# Patient Record
Sex: Female | Born: 1960 | Race: White | Hispanic: No | Marital: Single | Smoking: Former smoker
Health system: Southern US, Community
[De-identification: ages and names within clinical notes are randomized; demographics above are authoritative.]

## PROBLEM LIST (undated history)

## (undated) DIAGNOSIS — E785 Hyperlipidemia, unspecified: Secondary | ICD-10-CM

## (undated) HISTORY — DX: Hyperlipidemia, unspecified: E78.5

---

## 2007-04-27 ENCOUNTER — Encounter: Admission: RE | Admit: 2007-04-27 | Discharge: 2007-04-27 | Payer: Self-pay | Admitting: Family Medicine

## 2007-05-03 ENCOUNTER — Encounter: Admission: RE | Admit: 2007-05-03 | Discharge: 2007-05-03 | Payer: Self-pay | Admitting: Family Medicine

## 2009-07-08 ENCOUNTER — Encounter: Admission: RE | Admit: 2009-07-08 | Discharge: 2009-07-08 | Payer: Self-pay | Admitting: Family Medicine

## 2010-03-07 ENCOUNTER — Encounter: Payer: Self-pay | Admitting: Family Medicine

## 2014-10-23 ENCOUNTER — Ambulatory Visit: Payer: Self-pay | Admitting: Family

## 2014-10-23 ENCOUNTER — Encounter: Payer: Self-pay | Admitting: Family

## 2014-10-23 VITALS — BP 138/93 | HR 78 | Temp 98.6°F | Ht 64.0 in | Wt 216.4 lb

## 2014-10-23 DIAGNOSIS — M5412 Radiculopathy, cervical region: Secondary | ICD-10-CM

## 2014-10-23 MED ORDER — METHYLPREDNISOLONE ACETATE 80 MG/ML IJ SUSP
80.0000 mg | Freq: Once | INTRAMUSCULAR | Status: AC
  Administered 2014-10-23: 80 mg via INTRAMUSCULAR

## 2014-10-23 MED ORDER — MELOXICAM 15 MG PO TABS: 15.0000 mg | ORAL_TABLET | Freq: Every day | ORAL | Status: DC

## 2014-10-23 MED ORDER — KETOROLAC TROMETHAMINE 60 MG/2ML IM SOLN
60.0000 mg | Freq: Once | INTRAMUSCULAR | Status: AC
  Administered 2014-10-23: 60 mg via INTRAMUSCULAR

## 2014-10-23 MED ORDER — CYCLOBENZAPRINE HCL 10 MG PO TABS: 10.0000 mg | ORAL_TABLET | Freq: Three times a day (TID) | ORAL | Status: DC | PRN

## 2014-10-23 NOTE — Patient Instructions (Signed)

## 2014-10-23 NOTE — Progress Notes (Signed)
   Subjective:    Patient ID: Patty Bullock, female    DOB: 1960-11-14, 54 y.o.   MRN: [id number]  Pt presents to the office today to establish care. Pt states she is having left arm and shoulder pain. Pt states about three weeks ago she woke up in the middle of the night with the pain. Pt states she thought she slept or moved wrong and cause an injury. Pt was seen at the Urgent Care and was told she had a "pulled muscle". She was given a muscle relaxer and NSAID's for one week. Pt states that seemed to help a little.  Arm Pain  The incident occurred more than 1 week ago (three weeks ago). There was no injury mechanism. The pain is present in the left shoulder. The quality of the pain is described as aching. The pain is at a severity of 8/10. The pain is moderate. Associated symptoms include numbness (Left index, middle, and ring finger). She has tried NSAIDs and rest for the symptoms. The treatment provided mild relief.      Review of Systems  Constitutional: Negative.   HENT: Negative.   Eyes: Negative.   Respiratory: Negative.  Negative for shortness of breath.   Cardiovascular: Negative.  Negative for palpitations.  Gastrointestinal: Negative.   Endocrine: Negative.   Genitourinary: Negative.   Musculoskeletal: Negative.   Neurological: Positive for numbness (Left index, middle, and ring finger). Negative for headaches.  Hematological: Negative.   Psychiatric/Behavioral: Negative.   All other systems reviewed and are negative.      Objective:   Physical Exam  Constitutional: She is oriented to person, place, and time. She appears well-developed and well-nourished. No distress.  HENT:  Head: Normocephalic and atraumatic.  Eyes: Pupils are equal, round, and reactive to light.  Neck: Normal range of motion. Neck supple. No thyromegaly present.  Cardiovascular: Normal rate, regular rhythm, normal heart sounds and intact distal pulses.   No murmur heard. Pulmonary/Chest: Effort  normal and breath sounds normal. No respiratory distress. She has no wheezes.  Abdominal: Soft. Bowel sounds are normal. She exhibits no distension. There is no tenderness.  Musculoskeletal: Normal range of motion. She exhibits no edema or tenderness.  Neurological: She is alert and oriented to person, place, and time. She has normal reflexes. No cranial nerve deficit.  Skin: Skin is warm and dry.  Psychiatric: She has a normal mood and affect. Her behavior is normal. Judgment and thought content normal.  Vitals reviewed.   BP 138/93 mmHg  Pulse 78  Temp(Src) 98.6 F (37 C) (Oral)  Ht _0  (1.626 m)  Wt 216 lb 6.4 oz (98.158 kg)  BMI 37.13 kg/m2       Assessment & Plan:  1. Cervical radiculitis -Rest -Ice and heat as needed -Sedation precaution discussed -No other NSAID's while taking mobic -RTO prn - methylPREDNISolone acetate (DEPO-MEDROL) injection 80 mg; Inject 1 mL (80 mg total) into the muscle once. - ketorolac (TORADOL) injection 60 mg; Inject 2 mLs (60 mg total) into the muscle once. - BMP8+EGFR - cyclobenzaprine (FLEXERIL) 10 MG tablet; Take 1 tablet (10 mg total) by mouth 3 (three) times daily as needed for muscle spasms.  Dispense: 60 tablet; Refill: 1 - meloxicam (MOBIC) 15 MG tablet; Take 1 tablet (15 mg total) by mouth daily.  Dispense: 30 tablet; Refill: 0  Evelina Dun, FNP

## 2014-10-24 LAB — BMP8+EGFR
BUN / CREAT RATIO: 42 — AB (ref 9–23)
BUN: 26 mg/dL — AB (ref 6–24)
CO2: 23 mmol/L (ref 18–29)
CREATININE: 0.62 mg/dL (ref 0.57–1.00)
Calcium: 9.4 mg/dL (ref 8.7–10.2)
Chloride: 100 mmol/L (ref 97–108)
GFR calc non Af Amer: 103 mL/min/{1.73_m2} (ref >59)
GFR, EST AFRICAN AMERICAN: 118 mL/min/{1.73_m2} (ref >59)
Glucose: 94 mg/dL (ref 65–99)
Potassium: 4.5 mmol/L (ref 3.5–5.2)
Sodium: 140 mmol/L (ref 134–144)

## 2015-03-25 ENCOUNTER — Telehealth: Payer: Self-pay | Admitting: Family

## 2017-04-17 ENCOUNTER — Encounter: Payer: Self-pay | Admitting: Nurse Practitioner

## 2017-04-17 ENCOUNTER — Ambulatory Visit (INDEPENDENT_AMBULATORY_CARE_PROVIDER_SITE_OTHER): Payer: Self-pay | Admitting: Nurse Practitioner

## 2017-04-17 VITALS — BP 111/73 | HR 78 | Temp 97.8°F | Ht 64.0 in | Wt 234.8 lb

## 2017-04-17 DIAGNOSIS — J069 Acute upper respiratory infection, unspecified: Secondary | ICD-10-CM

## 2017-04-17 MED ORDER — BENZONATATE 100 MG PO CAPS: 100.0000 mg | ORAL_CAPSULE | Freq: Three times a day (TID) | ORAL | 0 refills | Status: DC | PRN

## 2017-04-17 NOTE — Progress Notes (Signed)
   Subjective:    Patient ID: Patty GreenerDonna R Zambito, female    DOB: 12/06/1960, 57 y.o.   MRN: [id number]  HPI Patient comes in today c/o being 6 for 6 days. hse has had cough , congestion, and achiness. Sh ehas ben using OTC cough syrup and slept a lot.    Review of Systems  Constitutional: Positive for appetite change (decreased), chills and fever.  HENT: Positive for congestion, rhinorrhea and sore throat. Negative for trouble swallowing.   Respiratory: Positive for cough (productive- yellowish). Negative for shortness of breath.   Cardiovascular: Negative.   Gastrointestinal: Negative.   Genitourinary: Negative.   Neurological: Positive for headaches.  Psychiatric/Behavioral: Negative.   All other systems reviewed and are negative.      Objective:   Physical Exam  Constitutional: She is oriented to person, place, and time. She appears well-developed and well-nourished. She appears distressed (mild).  HENT:  Right Ear: Hearing, tympanic membrane, external ear and ear canal normal.  Left Ear: Hearing, tympanic membrane, external ear and ear canal normal.  Nose: Mucosal edema and rhinorrhea present. Right sinus exhibits no maxillary sinus tenderness and no frontal sinus tenderness. Left sinus exhibits no maxillary sinus tenderness and no frontal sinus tenderness.  Mouth/Throat: Uvula is midline, oropharynx is clear and moist and mucous membranes are normal.  Neck: Normal range of motion.  Cardiovascular: Normal rate and regular rhythm.  Pulmonary/Chest: Effort normal and breath sounds normal. No respiratory distress. She has no wheezes. She has no rales.  Dry cough   Lymphadenopathy:    She has no cervical adenopathy.  Neurological: She is alert and oriented to person, place, and time.  Skin: Skin is warm.  Psychiatric: She has a normal mood and affect. Her behavior is normal. Judgment and thought content normal.      BP 111/73 (BP Location: Left Arm, Patient Position: Sitting,  Cuff Size: Large)   Pulse 78   Temp 97.8 F (36.6 C) (Oral)   Ht 5\' 4"  (1.626 m)   Wt 234 lb 12.8 oz (106.5 kg)   BMI 40.30 kg/m     Assessment & Plan:  1. Viral upper respiratory infection Probably had flu earlier To late for tamiflu 1. Take meds as prescribed 2. Use a cool mist humidifier especially during the winter months and when heat has been humid. 3. Use saline nose sprays frequently 4. Saline irrigations of the nose can be very helpful if done frequently.  * 4X daily for 1 week*  * Use of a nettie pot can be helpful with this. Follow directions with this* 5. Drink plenty of fluids 6. Keep thermostat turn down low 7.For any cough or congestion  Use plain Mucinex- regular strength or max strength is fine   * Children- consult with Pharmacist for dosing 8. For fever or aces or pains- take tylenol or ibuprofen appropriate for age and weight.  * for fevers greater than 101 orally you may alternate ibuprofen and tylenol every  3 hours.   Meds ordered this encounter  Medications  . benzonatate (TESSALON PERLES) 100 MG capsule    Sig: Take 1 capsule (100 mg total) by mouth 3 (three) times daily as needed for cough.    Dispense:  20 capsule    Refill:  0    Order Specific Question:   Supervising Provider    Answer:   Johna SheriffVINCENT, CAROL L [[id number]]   Mary-Margaret Daphine DeutscherMartin, FNP

## 2017-04-17 NOTE — Patient Instructions (Signed)

## 2019-08-15 DEATH — deceased

## 2019-12-12 ENCOUNTER — Other Ambulatory Visit: Payer: Self-pay

## 2019-12-12 ENCOUNTER — Other Ambulatory Visit (HOSPITAL_COMMUNITY)
Admission: RE | Admit: 2019-12-12 | Discharge: 2019-12-12 | Disposition: A | Discharge status: Home / Self Care | Payer: BC Managed Care – PPO | Source: Ambulatory Visit | Attending: Nurse Practitioner | Admitting: Nurse Practitioner

## 2019-12-12 ENCOUNTER — Encounter: Payer: Self-pay | Admitting: Nurse Practitioner

## 2019-12-12 ENCOUNTER — Ambulatory Visit (INDEPENDENT_AMBULATORY_CARE_PROVIDER_SITE_OTHER): Payer: BC Managed Care – PPO | Admitting: Nurse Practitioner

## 2019-12-12 VITALS — BP 152/101 | HR 69 | Temp 97.8°F | Resp 20 | Ht 64.0 in | Wt 224.0 lb

## 2019-12-12 DIAGNOSIS — Z0000 Encounter for general adult medical examination without abnormal findings: Secondary | ICD-10-CM | POA: Insufficient documentation

## 2019-12-12 DIAGNOSIS — E039 Hypothyroidism, unspecified: Secondary | ICD-10-CM | POA: Diagnosis not present

## 2019-12-12 DIAGNOSIS — I10 Essential (primary) hypertension: Secondary | ICD-10-CM | POA: Diagnosis not present

## 2019-12-12 DIAGNOSIS — Z0001 Encounter for general adult medical examination with abnormal findings: Secondary | ICD-10-CM

## 2019-12-12 DIAGNOSIS — Z6838 Body mass index (BMI) 38.0-38.9, adult: Secondary | ICD-10-CM | POA: Diagnosis not present

## 2019-12-12 MED ORDER — LISINOPRIL 20 MG PO TABS: 20.0000 mg | ORAL_TABLET | Freq: Every day | ORAL | 3 refills | Status: DC

## 2019-12-12 NOTE — Progress Notes (Signed)
Subjective:    Patient ID: Patty Bullock, female    DOB: [birth year], 59 y.o.   MRN: [id number]   Chief Complaint: annual physical  Patient come sin today for annual physical exam and PAP. Patient has no current medical problems and iis  On no medications. She has no complaints today.  Denies any blood pressure issues but she doe snot check her blood pressure at home  Review of Systems  Constitutional: Negative for diaphoresis.  Eyes: Negative for pain.  Respiratory: Negative for shortness of breath.   Cardiovascular: Negative for chest pain, palpitations and leg swelling.  Gastrointestinal: Negative for abdominal pain.  Endocrine: Negative for polydipsia.  Skin: Negative for rash.  Neurological: Negative for dizziness, weakness and headaches.  Hematological: Does not bruise/bleed easily.  All other systems reviewed and are negative.      Objective:   Physical Exam Vitals and nursing note reviewed.  Constitutional:      General: She is not in acute distress.    Appearance: Normal appearance. She is well-developed.  HENT:     Head: Normocephalic.     Nose: Nose normal.  Eyes:     Pupils: Pupils are equal, round, and reactive to light.  Neck:     Vascular: No carotid bruit or JVD.  Cardiovascular:     Rate and Rhythm: Normal rate and regular rhythm.     Heart sounds: Normal heart sounds.  Pulmonary:     Effort: Pulmonary effort is normal. No respiratory distress.     Breath sounds: Normal breath sounds. No wheezing or rales.  Chest:     Chest wall: No tenderness.  Abdominal:     General: Bowel sounds are normal. There is no distension or abdominal bruit.     Palpations: Abdomen is soft. There is no hepatomegaly, splenomegaly, mass or pulsatile mass.     Tenderness: There is no abdominal tenderness.  Genitourinary:    General: Normal vulva.     Vagina: No vaginal discharge.     Rectum: Normal.     Comments: Cervix parous and pink No adnexal masses or  tenderness Musculoskeletal:        General: Normal range of motion.     Cervical back: Normal range of motion and neck supple.  Lymphadenopathy:     Cervical: No cervical adenopathy.  Skin:    General: Skin is warm and dry.  Neurological:     Mental Status: She is alert and oriented to person, place, and time.     Deep Tendon Reflexes: Reflexes are normal and symmetric.  Psychiatric:        Behavior: Behavior normal.        Thought Content: Thought content normal.        Judgment: Judgment normal.     BP (!) 152/101   Pulse 69   Temp 97.8 F (36.6 C) (Temporal)   Resp 20   Ht 5' 4"  (1.626 m)   Wt 224 lb (101.6 kg)   SpO2 97%   BMI 38.45 kg/m           Assessment & Plan:  Patty Bullock comes in today with chief complaint of Annual Exam (Neck pain)   Diagnosis and orders addressed:  1. Annual physical exam - CBC with Differential/Platelet - CMP14+EGFR - Lipid panel - Thyroid Panel With TSH - VITAMIN D 25 Hydroxy (Vit-D Deficiency, Fractures) - Cytology - PAP  2. BMI 38.0-38.9,adult Discussed diet and exercise for person with BMI >25 Will  recheck weight in 3-6 months   3. Primary hypertension Meds ordered this encounter  Medications  . lisinopril (ZESTRIL) 20 MG tablet    Sig: Take 1 tablet (20 mg total) by mouth daily.    Dispense:  90 tablet    Refill:  3    Order Specific Question:   Supervising Provider    Answer:   Caryl Pina A [id number]   Start lisinopril Low sodium diet Keep diary of blood pressure at home.   Labs pending Health Maintenance reviewed Diet and exercise encouraged  Follow up plan:  1 month   [place], FNP

## 2019-12-12 NOTE — Patient Instructions (Signed)
DASH Eating Plan DASH stands for "Dietary Approaches to Stop Hypertension." The DASH eating plan is a healthy eating plan that has been shown to reduce high blood pressure (hypertension). It may also reduce your risk for type 2 diabetes, heart disease, and stroke. The DASH eating plan may also help with weight loss. What are tips for following this plan?  General guidelines  Avoid eating more than 2,300 mg (milligrams) of salt (sodium) a day. If you have hypertension, you may need to reduce your sodium intake to 1,500 mg a day.  Limit alcohol intake to no more than 1 drink a day for nonpregnant women and 2 drinks a day for men. One drink equals 12 oz of beer, 5 oz of wine, or 1 oz of hard liquor.  Work with your health care provider to maintain a healthy body weight or to lose weight. Ask what an ideal weight is for you.  Get at least 30 minutes of exercise that causes your heart to beat faster (aerobic exercise) most days of the week. Activities may include walking, swimming, or biking.  Work with your health care provider or diet and nutrition specialist (dietitian) to adjust your eating plan to your individual calorie needs. Reading food labels   Check food labels for the amount of sodium per serving. Choose foods with less than 5 percent of the Daily Value of sodium. Generally, foods with less than 300 mg of sodium per serving fit into this eating plan.  To find whole grains, look for the word "whole" as the first word in the ingredient list. Shopping  Buy products labeled as "low-sodium" or "no salt added."  Buy fresh foods. Avoid canned foods and premade or frozen meals. Cooking  Avoid adding salt when cooking. Use salt-free seasonings or herbs instead of table salt or sea salt. Check with your health care provider or pharmacist before using salt substitutes.  Do not fry foods. Cook foods using healthy methods such as baking, boiling, grilling, and broiling instead.  Cook with  heart-healthy oils, such as olive, canola, soybean, or sunflower oil. Meal planning  Eat a balanced diet that includes: ? 5 or more servings of fruits and vegetables each day. At each meal, try to fill half of your plate with fruits and vegetables. ? Up to 6-8 servings of whole grains each day. ? Less than 6 oz of lean meat, poultry, or fish each day. A 3-oz serving of meat is about the same size as a deck of cards. One egg equals 1 oz. ? 2 servings of low-fat dairy each day. ? A serving of nuts, seeds, or beans 5 times each week. ? Heart-healthy fats. Healthy fats called Omega-3 fatty acids are found in foods such as flaxseeds and coldwater fish, like sardines, salmon, and mackerel.  Limit how much you eat of the following: ? Canned or prepackaged foods. ? Food that is high in trans fat, such as fried foods. ? Food that is high in saturated fat, such as fatty meat. ? Sweets, desserts, sugary drinks, and other foods with added sugar. ? Full-fat dairy products.  Do not salt foods before eating.  Try to eat at least 2 vegetarian meals each week.  Eat more home-cooked food and less restaurant, buffet, and fast food.  When eating at a restaurant, ask that your food be prepared with less salt or no salt, if possible. What foods are recommended? The items listed may not be a complete list. Talk with your dietitian about   what dietary choices are best for you. Grains Whole-grain or whole-wheat bread. Whole-grain or whole-wheat pasta. Brown rice. Oatmeal. Quinoa. Bulgur. Whole-grain and low-sodium cereals. Pita bread. Low-fat, low-sodium crackers. Whole-wheat flour tortillas. Vegetables Fresh or frozen vegetables (raw, steamed, roasted, or grilled). Low-sodium or reduced-sodium tomato and vegetable juice. Low-sodium or reduced-sodium tomato sauce and tomato paste. Low-sodium or reduced-sodium canned vegetables. Fruits All fresh, dried, or frozen fruit. Canned fruit in natural juice (without  added sugar). Meat and other protein foods Skinless chicken or turkey. Ground chicken or turkey. Pork with fat trimmed off. Fish and seafood. Egg whites. Dried beans, peas, or lentils. Unsalted nuts, nut butters, and seeds. Unsalted canned beans. Lean cuts of beef with fat trimmed off. Low-sodium, lean deli meat. Dairy Low-fat (1%) or fat-free (skim) milk. Fat-free, low-fat, or reduced-fat cheeses. Nonfat, low-sodium ricotta or cottage cheese. Low-fat or nonfat yogurt. Low-fat, low-sodium cheese. Fats and oils Soft margarine without trans fats. Vegetable oil. Low-fat, reduced-fat, or light mayonnaise and salad dressings (reduced-sodium). Canola, safflower, olive, soybean, and sunflower oils. Avocado. Seasoning and other foods Herbs. Spices. Seasoning mixes without salt. Unsalted popcorn and pretzels. Fat-free sweets. What foods are not recommended? The items listed may not be a complete list. Talk with your dietitian about what dietary choices are best for you. Grains Baked goods made with fat, such as croissants, muffins, or some breads. Dry pasta or rice meal packs. Vegetables Creamed or fried vegetables. Vegetables in a cheese sauce. Regular canned vegetables (not low-sodium or reduced-sodium). Regular canned tomato sauce and paste (not low-sodium or reduced-sodium). Regular tomato and vegetable juice (not low-sodium or reduced-sodium). Pickles. Olives. Fruits Canned fruit in a light or heavy syrup. Fried fruit. Fruit in cream or butter sauce. Meat and other protein foods Fatty cuts of meat. Ribs. Fried meat. Bacon. Sausage. Bologna and other processed lunch meats. Salami. Fatback. Hotdogs. Bratwurst. Salted nuts and seeds. Canned beans with added salt. Canned or smoked fish. Whole eggs or egg yolks. Chicken or turkey with skin. Dairy Whole or 2% milk, cream, and half-and-half. Whole or full-fat cream cheese. Whole-fat or sweetened yogurt. Full-fat cheese. Nondairy creamers. Whipped toppings.  Processed cheese and cheese spreads. Fats and oils Butter. Stick margarine. Lard. Shortening. Ghee. Bacon fat. Tropical oils, such as coconut, palm kernel, or palm oil. Seasoning and other foods Salted popcorn and pretzels. Onion salt, garlic salt, seasoned salt, table salt, and sea salt. Worcestershire sauce. Tartar sauce. Barbecue sauce. Teriyaki sauce. Soy sauce, including reduced-sodium. Steak sauce. Canned and packaged gravies. Fish sauce. Oyster sauce. Cocktail sauce. Horseradish that you find on the shelf. Ketchup. Mustard. Meat flavorings and tenderizers. Bouillon cubes. Hot sauce and Tabasco sauce. Premade or packaged marinades. Premade or packaged taco seasonings. Relishes. Regular salad dressings. Where to find more information:  National Heart, Lung, and Blood Institute: www.nhlbi.nih.gov  American Heart Association: www.heart.org Summary  The DASH eating plan is a healthy eating plan that has been shown to reduce high blood pressure (hypertension). It may also reduce your risk for type 2 diabetes, heart disease, and stroke.  With the DASH eating plan, you should limit salt (sodium) intake to 2,300 mg a day. If you have hypertension, you may need to reduce your sodium intake to 1,500 mg a day.  When on the DASH eating plan, aim to eat more fresh fruits and vegetables, whole grains, lean proteins, low-fat dairy, and heart-healthy fats.  Work with your health care provider or diet and nutrition specialist (dietitian) to adjust your eating plan to your   individual calorie needs. This information is not intended to replace advice given to you by your health care provider. Make sure you discuss any questions you have with your health care provider. Document Revised: 01/13/2017 Document Reviewed: 01/25/2016 Elsevier Patient Education  2020 Elsevier Inc.  

## 2019-12-13 ENCOUNTER — Telehealth: Payer: Self-pay

## 2019-12-13 DIAGNOSIS — I10 Essential (primary) hypertension: Secondary | ICD-10-CM | POA: Insufficient documentation

## 2019-12-13 DIAGNOSIS — E039 Hypothyroidism, unspecified: Secondary | ICD-10-CM | POA: Insufficient documentation

## 2019-12-13 LAB — LIPID PANEL
Chol/HDL Ratio: 3.9 ratio (ref 0.0–4.4)
Cholesterol, Total: 238 mg/dL — ABNORMAL HIGH (ref 100–199)
HDL: 61 mg/dL (ref >39)
LDL Chol Calc (NIH): 163 mg/dL — ABNORMAL HIGH (ref 0–99)
Triglycerides: 81 mg/dL (ref 0–149)
VLDL Cholesterol Cal: 14 mg/dL (ref 5–40)

## 2019-12-13 LAB — CBC WITH DIFFERENTIAL/PLATELET
Basophils Absolute: 0.1 10*3/uL (ref 0.0–0.2)
Basos: 1 %
EOS (ABSOLUTE): 0.3 10*3/uL (ref 0.0–0.4)
Eos: 3 %
Hematocrit: 42.1 % (ref 34.0–46.6)
Hemoglobin: 13.6 g/dL (ref 11.1–15.9)
Immature Grans (Abs): 0 10*3/uL (ref 0.0–0.1)
Immature Granulocytes: 0 %
Lymphocytes Absolute: 3.7 10*3/uL — ABNORMAL HIGH (ref 0.7–3.1)
Lymphs: 37 %
MCH: 30 pg (ref 26.6–33.0)
MCHC: 32.3 g/dL (ref 31.5–35.7)
MCV: 93 fL (ref 79–97)
Monocytes Absolute: 0.8 10*3/uL (ref 0.1–0.9)
Monocytes: 8 %
Neutrophils Absolute: 5.1 10*3/uL (ref 1.4–7.0)
Neutrophils: 51 %
Platelets: 293 10*3/uL (ref 150–450)
RBC: 4.54 x10E6/uL (ref 3.77–5.28)
RDW: 12.5 % (ref 11.7–15.4)
WBC: 9.9 10*3/uL (ref 3.4–10.8)

## 2019-12-13 LAB — CMP14+EGFR
ALT: 14 IU/L (ref 0–32)
AST: 21 IU/L (ref 0–40)
Albumin/Globulin Ratio: 1.6 (ref 1.2–2.2)
Albumin: 4.3 g/dL (ref 3.8–4.9)
Alkaline Phosphatase: 95 IU/L (ref 44–121)
BUN/Creatinine Ratio: 18 (ref 12–28)
BUN: 19 mg/dL (ref 8–27)
Bilirubin Total: <0.2 mg/dL (ref 0.0–1.2)
CO2: 28 mmol/L (ref 20–29)
Calcium: 9.4 mg/dL (ref 8.7–10.3)
Chloride: 100 mmol/L (ref 96–106)
Creatinine, Ser: 1.06 mg/dL — ABNORMAL HIGH (ref 0.57–1.00)
GFR calc Af Amer: 66 mL/min/{1.73_m2} (ref >59)
GFR calc non Af Amer: 57 mL/min/{1.73_m2} — ABNORMAL LOW (ref >59)
Globulin, Total: 2.7 g/dL (ref 1.5–4.5)
Glucose: 79 mg/dL (ref 65–99)
Potassium: 4.3 mmol/L (ref 3.5–5.2)
Sodium: 141 mmol/L (ref 134–144)
Total Protein: 7 g/dL (ref 6.0–8.5)

## 2019-12-13 LAB — THYROID PANEL WITH TSH
Free Thyroxine Index: 2.5 (ref 1.2–4.9)
T3 Uptake Ratio: 28 % (ref 24–39)
T4, Total: 9 ug/dL (ref 4.5–12.0)
TSH: 6.98 u[IU]/mL — ABNORMAL HIGH (ref 0.450–4.500)

## 2019-12-13 LAB — VITAMIN D 25 HYDROXY (VIT D DEFICIENCY, FRACTURES): Vit D, 25-Hydroxy: 24 ng/mL — ABNORMAL LOW (ref 30.0–100.0)

## 2019-12-13 MED ORDER — LEVOTHYROXINE SODIUM 50 MCG PO TABS: 50.0000 ug | ORAL_TABLET | Freq: Every day | ORAL | 1 refills | Status: DC

## 2019-12-13 NOTE — Telephone Encounter (Signed)
Pt aware of results and voiced understanding. 

## 2019-12-13 NOTE — Addendum Note (Signed)
Addended by: Bennie Pierini on: 12/13/2019 01:09 PM   Modules accepted: Orders

## 2019-12-16 ENCOUNTER — Other Ambulatory Visit: Payer: Self-pay | Admitting: Nurse Practitioner

## 2019-12-16 DIAGNOSIS — Z1231 Encounter for screening mammogram for malignant neoplasm of breast: Secondary | ICD-10-CM

## 2019-12-16 LAB — CYTOLOGY - PAP
Adequacy: ABSENT
Diagnosis: NEGATIVE

## 2020-01-13 ENCOUNTER — Other Ambulatory Visit: Payer: Self-pay

## 2020-01-13 ENCOUNTER — Ambulatory Visit
Admission: RE | Admit: 2020-01-13 | Discharge: 2020-01-13 | Disposition: A | Discharge status: Home / Self Care | Payer: BC Managed Care – PPO | Source: Ambulatory Visit | Attending: Nurse Practitioner | Admitting: Nurse Practitioner

## 2020-01-13 ENCOUNTER — Encounter: Payer: Self-pay | Admitting: Nurse Practitioner

## 2020-01-13 ENCOUNTER — Ambulatory Visit (INDEPENDENT_AMBULATORY_CARE_PROVIDER_SITE_OTHER): Payer: BC Managed Care – PPO | Admitting: Nurse Practitioner

## 2020-01-13 VITALS — BP 121/79 | HR 70 | Temp 98.0°F | Resp 20 | Ht 64.0 in | Wt 219.0 lb

## 2020-01-13 DIAGNOSIS — I10 Essential (primary) hypertension: Secondary | ICD-10-CM | POA: Diagnosis not present

## 2020-01-13 DIAGNOSIS — E039 Hypothyroidism, unspecified: Secondary | ICD-10-CM | POA: Diagnosis not present

## 2020-01-13 DIAGNOSIS — Z1231 Encounter for screening mammogram for malignant neoplasm of breast: Secondary | ICD-10-CM

## 2020-01-13 DIAGNOSIS — Z1211 Encounter for screening for malignant neoplasm of colon: Secondary | ICD-10-CM | POA: Diagnosis not present

## 2020-01-13 MED ORDER — LISINOPRIL 20 MG PO TABS: 20.0000 mg | ORAL_TABLET | Freq: Every day | ORAL | 3 refills | Status: DC

## 2020-01-13 MED ORDER — LEVOTHYROXINE SODIUM 50 MCG PO TABS: 50.0000 ug | ORAL_TABLET | Freq: Every day | ORAL | 1 refills | Status: DC

## 2020-01-13 NOTE — Patient Instructions (Signed)
DASH Eating Plan DASH stands for "Dietary Approaches to Stop Hypertension." The DASH eating plan is a healthy eating plan that has been shown to reduce high blood pressure (hypertension). It may also reduce your risk for type 2 diabetes, heart disease, and stroke. The DASH eating plan may also help with weight loss. What are tips for following this plan?  General guidelines  Avoid eating more than 2,300 mg (milligrams) of salt (sodium) a day. If you have hypertension, you may need to reduce your sodium intake to 1,500 mg a day.  Limit alcohol intake to no more than 1 drink a day for nonpregnant women and 2 drinks a day for men. One drink equals 12 oz of beer, 5 oz of wine, or 1 oz of hard liquor.  Work with your health care provider to maintain a healthy body weight or to lose weight. Ask what an ideal weight is for you.  Get at least 30 minutes of exercise that causes your heart to beat faster (aerobic exercise) most days of the week. Activities may include walking, swimming, or biking.  Work with your health care provider or diet and nutrition specialist (dietitian) to adjust your eating plan to your individual calorie needs. Reading food labels   Check food labels for the amount of sodium per serving. Choose foods with less than 5 percent of the Daily Value of sodium. Generally, foods with less than 300 mg of sodium per serving fit into this eating plan.  To find whole grains, look for the word "whole" as the first word in the ingredient list. Shopping  Buy products labeled as "low-sodium" or "no salt added."  Buy fresh foods. Avoid canned foods and premade or frozen meals. Cooking  Avoid adding salt when cooking. Use salt-free seasonings or herbs instead of table salt or sea salt. Check with your health care provider or pharmacist before using salt substitutes.  Do not fry foods. Cook foods using healthy methods such as baking, boiling, grilling, and broiling instead.  Cook with  heart-healthy oils, such as olive, canola, soybean, or sunflower oil. Meal planning  Eat a balanced diet that includes: ? 5 or more servings of fruits and vegetables each day. At each meal, try to fill half of your plate with fruits and vegetables. ? Up to 6-8 servings of whole grains each day. ? Less than 6 oz of lean meat, poultry, or fish each day. A 3-oz serving of meat is about the same size as a deck of cards. One egg equals 1 oz. ? 2 servings of low-fat dairy each day. ? A serving of nuts, seeds, or beans 5 times each week. ? Heart-healthy fats. Healthy fats called Omega-3 fatty acids are found in foods such as flaxseeds and coldwater fish, like sardines, salmon, and mackerel.  Limit how much you eat of the following: ? Canned or prepackaged foods. ? Food that is high in trans fat, such as fried foods. ? Food that is high in saturated fat, such as fatty meat. ? Sweets, desserts, sugary drinks, and other foods with added sugar. ? Full-fat dairy products.  Do not salt foods before eating.  Try to eat at least 2 vegetarian meals each week.  Eat more home-cooked food and less restaurant, buffet, and fast food.  When eating at a restaurant, ask that your food be prepared with less salt or no salt, if possible. What foods are recommended? The items listed may not be a complete list. Talk with your dietitian about   what dietary choices are best for you. Grains Whole-grain or whole-wheat bread. Whole-grain or whole-wheat pasta. Brown rice. Oatmeal. Quinoa. Bulgur. Whole-grain and low-sodium cereals. Pita bread. Low-fat, low-sodium crackers. Whole-wheat flour tortillas. Vegetables Fresh or frozen vegetables (raw, steamed, roasted, or grilled). Low-sodium or reduced-sodium tomato and vegetable juice. Low-sodium or reduced-sodium tomato sauce and tomato paste. Low-sodium or reduced-sodium canned vegetables. Fruits All fresh, dried, or frozen fruit. Canned fruit in natural juice (without  added sugar). Meat and other protein foods Skinless chicken or turkey. Ground chicken or turkey. Pork with fat trimmed off. Fish and seafood. Egg whites. Dried beans, peas, or lentils. Unsalted nuts, nut butters, and seeds. Unsalted canned beans. Lean cuts of beef with fat trimmed off. Low-sodium, lean deli meat. Dairy Low-fat (1%) or fat-free (skim) milk. Fat-free, low-fat, or reduced-fat cheeses. Nonfat, low-sodium ricotta or cottage cheese. Low-fat or nonfat yogurt. Low-fat, low-sodium cheese. Fats and oils Soft margarine without trans fats. Vegetable oil. Low-fat, reduced-fat, or light mayonnaise and salad dressings (reduced-sodium). Canola, safflower, olive, soybean, and sunflower oils. Avocado. Seasoning and other foods Herbs. Spices. Seasoning mixes without salt. Unsalted popcorn and pretzels. Fat-free sweets. What foods are not recommended? The items listed may not be a complete list. Talk with your dietitian about what dietary choices are best for you. Grains Baked goods made with fat, such as croissants, muffins, or some breads. Dry pasta or rice meal packs. Vegetables Creamed or fried vegetables. Vegetables in a cheese sauce. Regular canned vegetables (not low-sodium or reduced-sodium). Regular canned tomato sauce and paste (not low-sodium or reduced-sodium). Regular tomato and vegetable juice (not low-sodium or reduced-sodium). Pickles. Olives. Fruits Canned fruit in a light or heavy syrup. Fried fruit. Fruit in cream or butter sauce. Meat and other protein foods Fatty cuts of meat. Ribs. Fried meat. Bacon. Sausage. Bologna and other processed lunch meats. Salami. Fatback. Hotdogs. Bratwurst. Salted nuts and seeds. Canned beans with added salt. Canned or smoked fish. Whole eggs or egg yolks. Chicken or turkey with skin. Dairy Whole or 2% milk, cream, and half-and-half. Whole or full-fat cream cheese. Whole-fat or sweetened yogurt. Full-fat cheese. Nondairy creamers. Whipped toppings.  Processed cheese and cheese spreads. Fats and oils Butter. Stick margarine. Lard. Shortening. Ghee. Bacon fat. Tropical oils, such as coconut, palm kernel, or palm oil. Seasoning and other foods Salted popcorn and pretzels. Onion salt, garlic salt, seasoned salt, table salt, and sea salt. Worcestershire sauce. Tartar sauce. Barbecue sauce. Teriyaki sauce. Soy sauce, including reduced-sodium. Steak sauce. Canned and packaged gravies. Fish sauce. Oyster sauce. Cocktail sauce. Horseradish that you find on the shelf. Ketchup. Mustard. Meat flavorings and tenderizers. Bouillon cubes. Hot sauce and Tabasco sauce. Premade or packaged marinades. Premade or packaged taco seasonings. Relishes. Regular salad dressings. Where to find more information:  National Heart, Lung, and Blood Institute: www.nhlbi.nih.gov  American Heart Association: www.heart.org Summary  The DASH eating plan is a healthy eating plan that has been shown to reduce high blood pressure (hypertension). It may also reduce your risk for type 2 diabetes, heart disease, and stroke.  With the DASH eating plan, you should limit salt (sodium) intake to 2,300 mg a day. If you have hypertension, you may need to reduce your sodium intake to 1,500 mg a day.  When on the DASH eating plan, aim to eat more fresh fruits and vegetables, whole grains, lean proteins, low-fat dairy, and heart-healthy fats.  Work with your health care provider or diet and nutrition specialist (dietitian) to adjust your eating plan to your   individual calorie needs. This information is not intended to replace advice given to you by your health care provider. Make sure you discuss any questions you have with your health care provider. Document Revised: 01/13/2017 Document Reviewed: 01/25/2016 Elsevier Patient Education  2020 Elsevier Inc.  

## 2020-01-13 NOTE — Progress Notes (Signed)
Subjective:    Patient ID: Patty Bullock, female    DOB: 1960/02/17, 59 y.o.   MRN: [id number]   Chief Complaint: Medical Management of Chronic Issues    HPI:  1. Primary hypertension No c/o chest pain, sob or headaches. Does not check blood pressure at home. BP Readings from Last 3 Encounters:  01/13/20 121/79  12/12/19 (!) 152/101  04/17/17 111/73     2. Acquired hypothyroidism Having no problems that she is aware of.     Outpatient Encounter Medications as of 01/13/2020  Medication Sig  . levothyroxine (SYNTHROID) 50 MCG tablet Take 1 tablet (50 mcg total) by mouth daily.  Marland Kitchen lisinopril (ZESTRIL) 20 MG tablet Take 1 tablet (20 mg total) by mouth daily.   No facility-administered encounter medications on file as of 01/13/2020.    History reviewed. No pertinent surgical history.  Family History  Adopted: Yes  Family history unknown: Yes    New complaints: None today  Social history: Lives her boyfriend  Controlled substance contract: n/a    Review of Systems  Constitutional: Negative for diaphoresis.  Eyes: Negative for pain.  Respiratory: Negative for shortness of breath.   Cardiovascular: Negative for chest pain, palpitations and leg swelling.  Gastrointestinal: Negative for abdominal pain.  Endocrine: Negative for polydipsia.  Skin: Negative for rash.  Neurological: Negative for dizziness, weakness and headaches.  Hematological: Does not bruise/bleed easily.  All other systems reviewed and are negative.      Objective:   Physical Exam Vitals and nursing note reviewed.  Constitutional:      General: She is not in acute distress.    Appearance: Normal appearance. She is well-developed.  HENT:     Head: Normocephalic.     Nose: Nose normal.  Eyes:     Pupils: Pupils are equal, round, and reactive to light.  Neck:     Vascular: No carotid bruit or JVD.  Cardiovascular:     Rate and Rhythm: Normal rate and regular rhythm.     Heart  sounds: Normal heart sounds.  Pulmonary:     Effort: Pulmonary effort is normal. No respiratory distress.     Breath sounds: Normal breath sounds. No wheezing or rales.  Chest:     Chest wall: No tenderness.  Abdominal:     General: Bowel sounds are normal. There is no distension or abdominal bruit.     Palpations: Abdomen is soft. There is no hepatomegaly, splenomegaly, mass or pulsatile mass.     Tenderness: There is no abdominal tenderness.  Musculoskeletal:        General: Normal range of motion.     Cervical back: Normal range of motion and neck supple.  Lymphadenopathy:     Cervical: No cervical adenopathy.  Skin:    General: Skin is warm and dry.  Neurological:     Mental Status: She is alert and oriented to person, place, and time.     Deep Tendon Reflexes: Reflexes are normal and symmetric.  Psychiatric:        Behavior: Behavior normal.        Thought Content: Thought content normal.        Judgment: Judgment normal.    BP 121/79   Pulse 70   Temp 98 F (36.7 C) (Temporal)   Resp 20   Ht 5' 4"  (1.626 m)   Wt 219 lb (99.3 kg)   SpO2 98%   BMI 37.59 kg/m  Assessment & Plan:  NDIDI NESBY in today with chief complaint of Medical Management of Chronic Issues   1. Primary hypertension Low sodium diet - lisinopril (ZESTRIL) 20 MG tablet; Take 1 tablet (20 mg total) by mouth daily.  Dispense: 90 tablet; Refill: 3 - CBC with Differential/Platelet - CMP14+EGFR - Lipid panel  2. Acquired hypothyroidism Labs pending - levothyroxine (SYNTHROID) 50 MCG tablet; Take 1 tablet (50 mcg total) by mouth daily.  Dispense: 90 tablet; Refill: 1    The above assessment and management plan was discussed with the patient. The patient verbalized understanding of and has agreed to the management plan. Patient is aware to call the clinic if symptoms persist or worsen. Patient is aware when to return to the clinic for a follow-up visit. Patient educated on when it is  appropriate to go to the emergency department.   Mary-Margaret Hassell Done, FNP

## 2020-01-13 NOTE — Addendum Note (Signed)
Addended by: Bennie Pierini on: 01/13/2020 04:34 PM   Modules accepted: Orders

## 2020-01-14 LAB — CMP14+EGFR
ALT: 9 IU/L (ref 0–32)
AST: 13 IU/L (ref 0–40)
Albumin/Globulin Ratio: 1.9 (ref 1.2–2.2)
Albumin: 4.3 g/dL (ref 3.8–4.9)
Alkaline Phosphatase: 89 IU/L (ref 44–121)
BUN/Creatinine Ratio: 23 (ref 12–28)
BUN: 30 mg/dL — ABNORMAL HIGH (ref 8–27)
Bilirubin Total: <0.2 mg/dL (ref 0.0–1.2)
CO2: 26 mmol/L (ref 20–29)
Calcium: 9.1 mg/dL (ref 8.7–10.3)
Chloride: 105 mmol/L (ref 96–106)
Creatinine, Ser: 1.3 mg/dL — ABNORMAL HIGH (ref 0.57–1.00)
GFR calc Af Amer: 52 mL/min/{1.73_m2} — ABNORMAL LOW (ref >59)
GFR calc non Af Amer: 45 mL/min/{1.73_m2} — ABNORMAL LOW (ref >59)
Globulin, Total: 2.3 g/dL (ref 1.5–4.5)
Glucose: 93 mg/dL (ref 65–99)
Potassium: 4.3 mmol/L (ref 3.5–5.2)
Sodium: 142 mmol/L (ref 134–144)
Total Protein: 6.6 g/dL (ref 6.0–8.5)

## 2020-01-14 LAB — CBC WITH DIFFERENTIAL/PLATELET
Basophils Absolute: 0.1 10*3/uL (ref 0.0–0.2)
Basos: 1 %
EOS (ABSOLUTE): 0.1 10*3/uL (ref 0.0–0.4)
Eos: 1 %
Hematocrit: 39.4 % (ref 34.0–46.6)
Hemoglobin: 13.3 g/dL (ref 11.1–15.9)
Immature Grans (Abs): 0 10*3/uL (ref 0.0–0.1)
Immature Granulocytes: 0 %
Lymphocytes Absolute: 4.1 10*3/uL — ABNORMAL HIGH (ref 0.7–3.1)
Lymphs: 38 %
MCH: 30.5 pg (ref 26.6–33.0)
MCHC: 33.8 g/dL (ref 31.5–35.7)
MCV: 90 fL (ref 79–97)
Monocytes Absolute: 0.9 10*3/uL (ref 0.1–0.9)
Monocytes: 8 %
Neutrophils Absolute: 5.7 10*3/uL (ref 1.4–7.0)
Neutrophils: 52 %
Platelets: 294 10*3/uL (ref 150–450)
RBC: 4.36 x10E6/uL (ref 3.77–5.28)
RDW: 11.9 % (ref 11.7–15.4)
WBC: 10.8 10*3/uL (ref 3.4–10.8)

## 2020-01-14 LAB — LIPID PANEL
Chol/HDL Ratio: 4.5 ratio — ABNORMAL HIGH (ref 0.0–4.4)
Cholesterol, Total: 222 mg/dL — ABNORMAL HIGH (ref 100–199)
HDL: 49 mg/dL (ref >39)
LDL Chol Calc (NIH): 154 mg/dL — ABNORMAL HIGH (ref 0–99)
Triglycerides: 104 mg/dL (ref 0–149)
VLDL Cholesterol Cal: 19 mg/dL (ref 5–40)

## 2020-01-14 MED ORDER — ATORVASTATIN CALCIUM 40 MG PO TABS: 40.0000 mg | ORAL_TABLET | Freq: Every day | ORAL | 1 refills | Status: DC

## 2020-01-14 NOTE — Addendum Note (Signed)
Addended by: Bennie Pierini on: 01/14/2020 07:52 AM   Modules accepted: Orders

## 2020-01-16 ENCOUNTER — Telehealth: Payer: Self-pay | Admitting: Nurse Practitioner

## 2020-01-17 NOTE — Telephone Encounter (Signed)
Pt returned missed call regarding referral. Requested to be called back. Says she is at work but is currently on her lunch break from 11:30 to 12:00 and does not get off of work until 4:30.

## 2020-03-03 ENCOUNTER — Encounter: Payer: Self-pay | Admitting: Internal Medicine

## 2020-03-17 ENCOUNTER — Encounter: Payer: BC Managed Care – PPO | Admitting: Internal Medicine

## 2020-05-07 ENCOUNTER — Encounter: Payer: BC Managed Care – PPO | Admitting: Internal Medicine

## 2020-07-13 ENCOUNTER — Other Ambulatory Visit: Payer: Self-pay | Admitting: Nurse Practitioner

## 2020-07-13 DIAGNOSIS — E039 Hypothyroidism, unspecified: Secondary | ICD-10-CM

## 2020-08-19 ENCOUNTER — Other Ambulatory Visit: Payer: Self-pay | Admitting: Nurse Practitioner

## 2020-08-19 DIAGNOSIS — E039 Hypothyroidism, unspecified: Secondary | ICD-10-CM

## 2020-09-13 ENCOUNTER — Other Ambulatory Visit: Payer: Self-pay | Admitting: Nurse Practitioner

## 2020-12-21 IMAGING — MG DIGITAL SCREENING BILAT W/ TOMO W/ CAD
8 series · 8 of 24 positions shown · non-contrast
Comparison: Previous exam(s).

CLINICAL DATA: Screening.

EXAM:
DIGITAL SCREENING BILATERAL MAMMOGRAM WITH TOMO AND CAD

[R CC synth-2D]
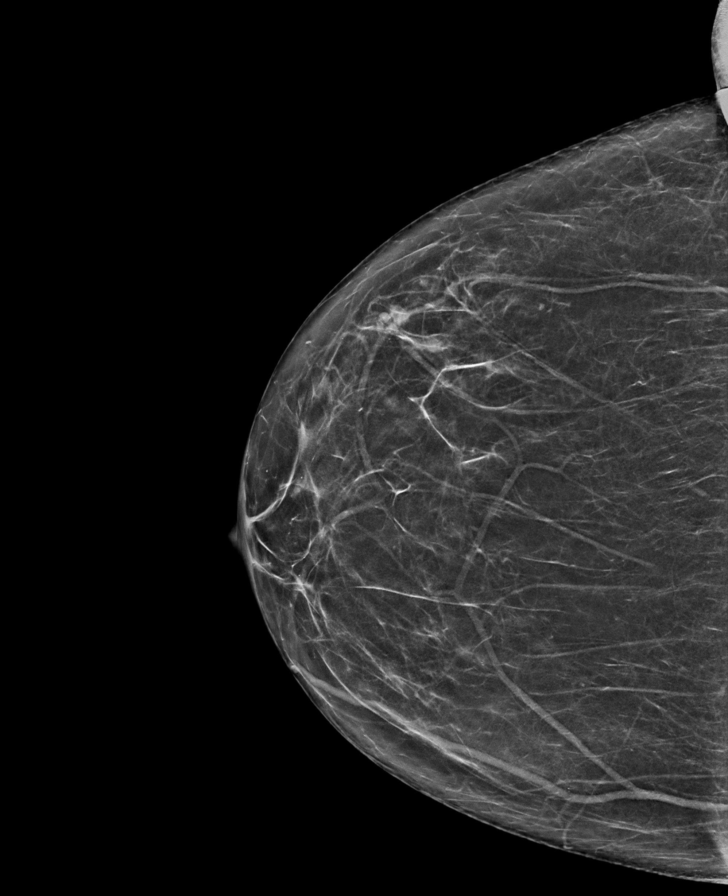

[L MLO synth-2D]
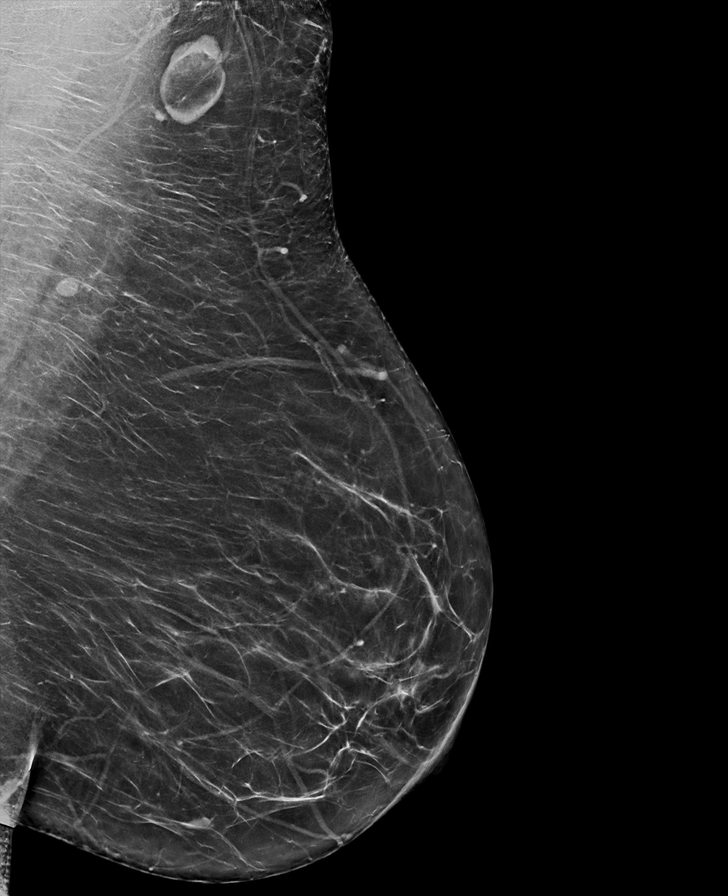

[R MLO synth-2D]
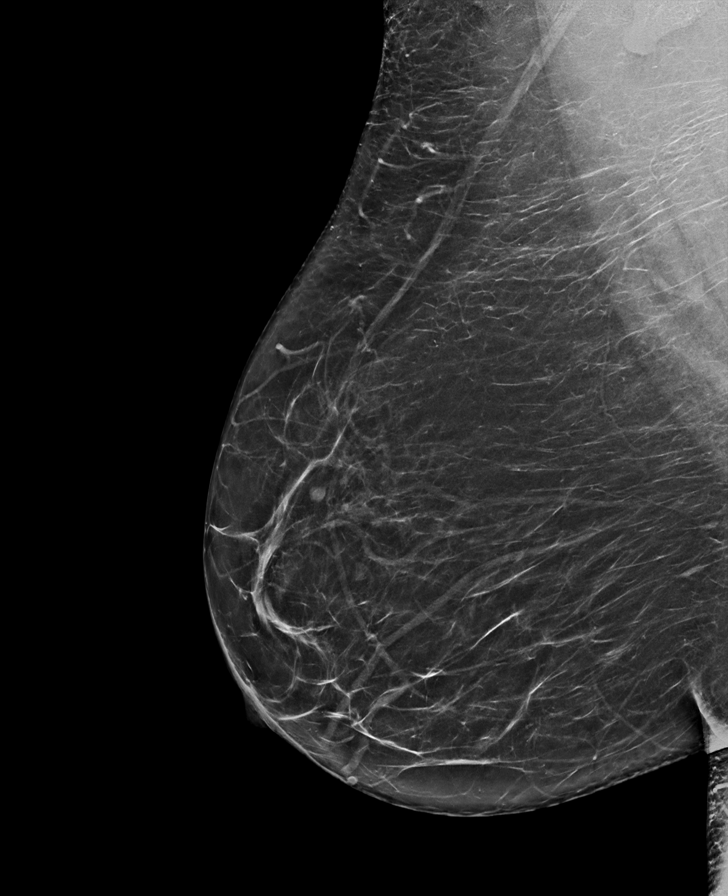

[L CC synth-2D]
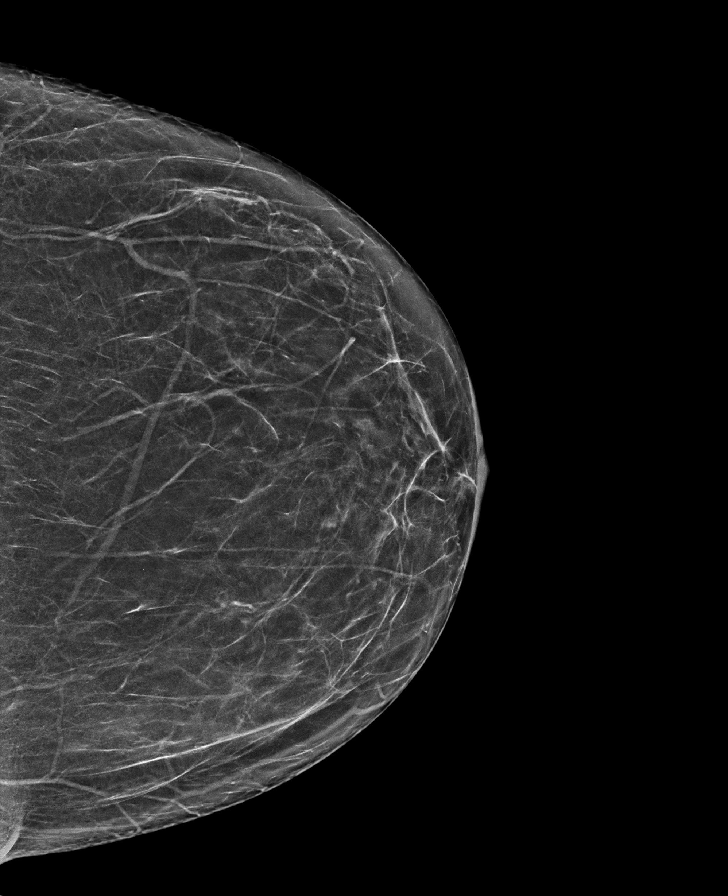

[R CC tomo · tomo slice 32/63.0]
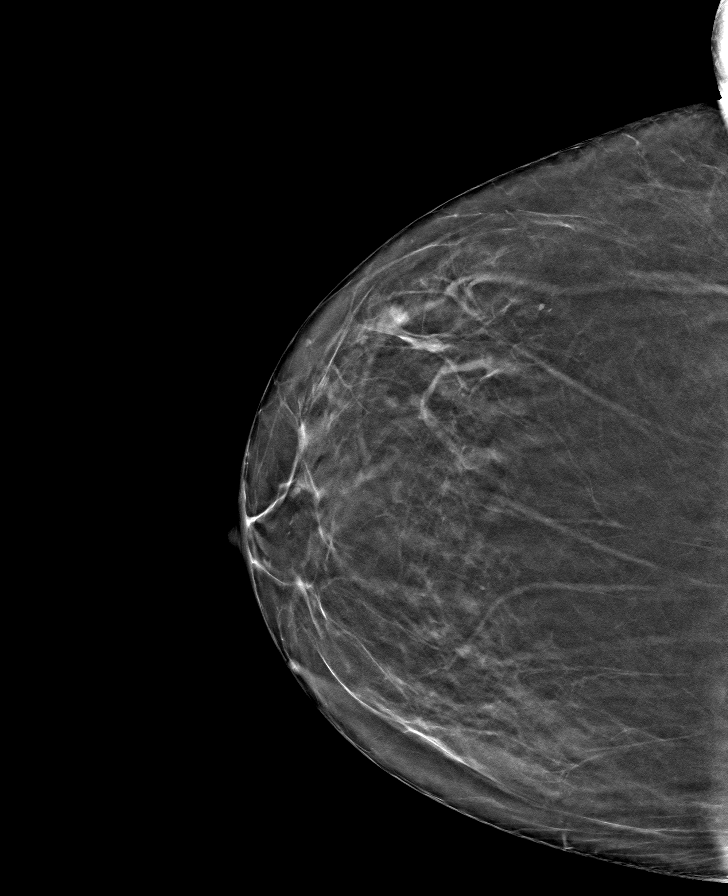

[L MLO tomo · tomo slice 38/75.0]
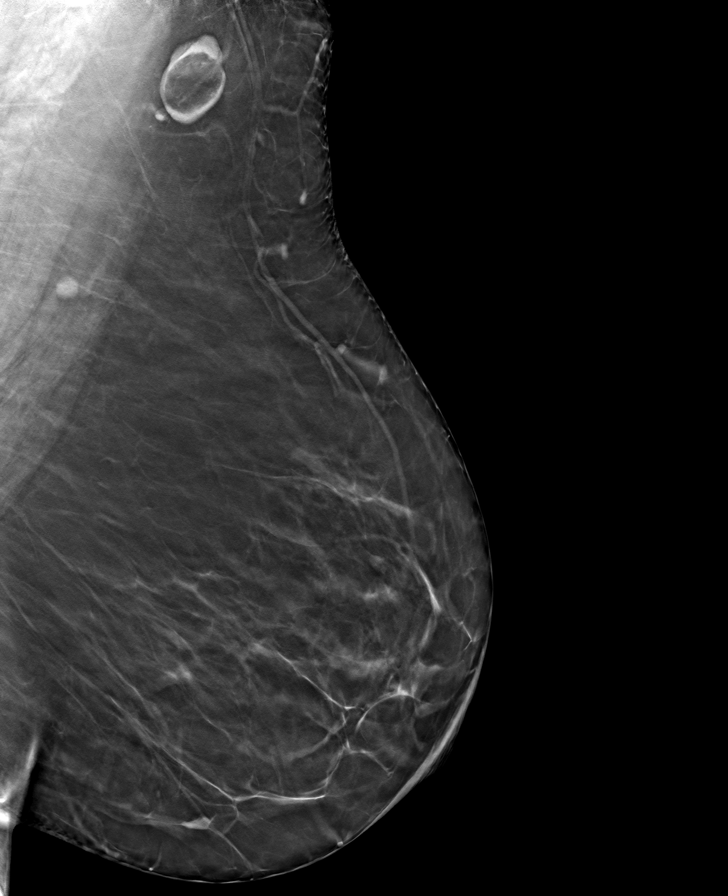

[L CC tomo · tomo slice 31/61.0]
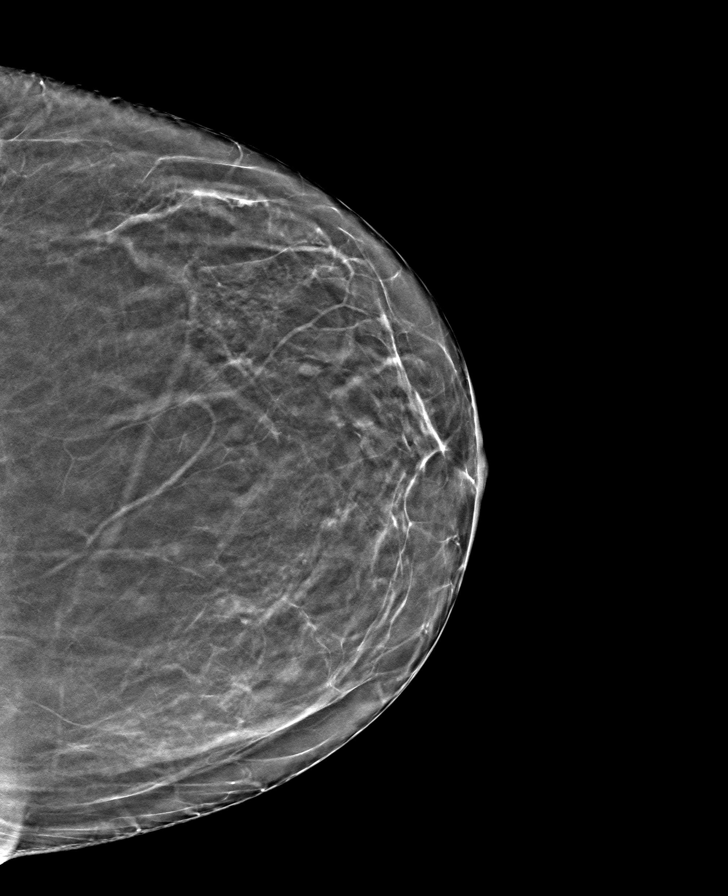

[R MLO tomo · tomo slice 37/73.0]
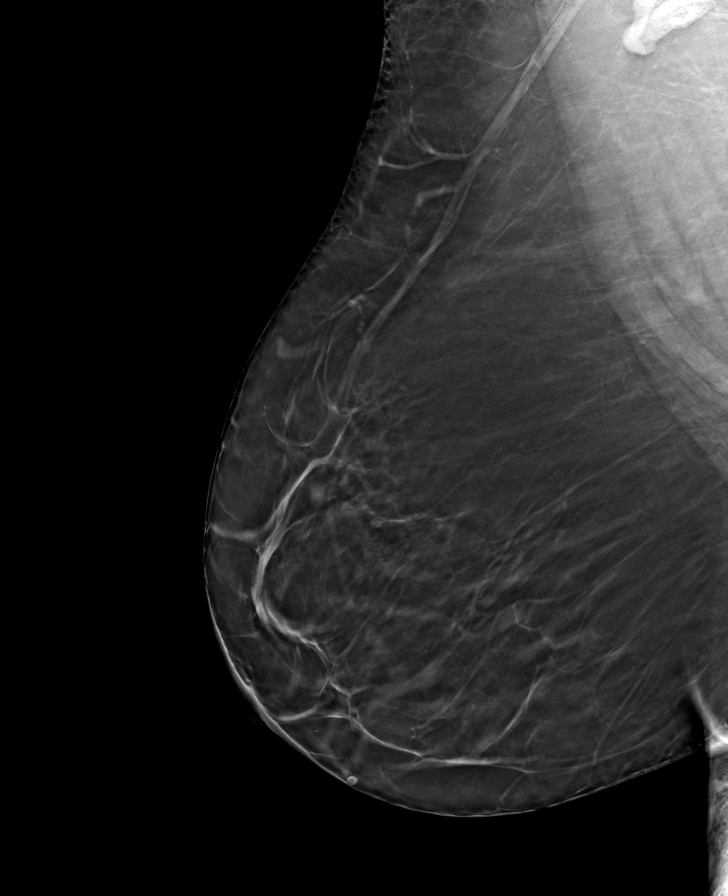

[8 of 24 positions shown; findings below may reference images not displayed]

ACR Breast Density Category b: There are scattered areas of
fibroglandular density.
FINDINGS: There are no findings suspicious for malignancy. Images were
processed with CAD.
IMPRESSION: No mammographic evidence of malignancy. A result letter of this
screening mammogram will be mailed directly to the patient.

RECOMMENDATION:
Screening mammogram in one year. (Code:CN-[id number])

BI-RADS CATEGORY  1: Negative.

## 2021-01-17 ENCOUNTER — Other Ambulatory Visit: Payer: Self-pay | Admitting: Nurse Practitioner

## 2021-01-17 DIAGNOSIS — I10 Essential (primary) hypertension: Secondary | ICD-10-CM

## 2021-01-30 ENCOUNTER — Other Ambulatory Visit: Payer: Self-pay | Admitting: Nurse Practitioner

## 2021-01-30 DIAGNOSIS — I10 Essential (primary) hypertension: Secondary | ICD-10-CM

## 2022-03-01 ENCOUNTER — Encounter: Payer: Self-pay | Admitting: Nurse Practitioner

## 2022-08-02 ENCOUNTER — Encounter: Payer: Self-pay | Admitting: Nurse Practitioner

## 2022-08-02 ENCOUNTER — Ambulatory Visit (INDEPENDENT_AMBULATORY_CARE_PROVIDER_SITE_OTHER): Payer: BC Managed Care – PPO | Admitting: Nurse Practitioner

## 2022-08-02 VITALS — BP 153/92 | HR 68 | Temp 97.8°F | Resp 20 | Ht 64.0 in | Wt 203.0 lb

## 2022-08-02 DIAGNOSIS — M25561 Pain in right knee: Secondary | ICD-10-CM | POA: Diagnosis not present

## 2022-08-02 MED ORDER — CELECOXIB 200 MG PO CAPS: 200.0000 mg | ORAL_CAPSULE | Freq: Two times a day (BID) | ORAL | 0 refills | Status: AC

## 2022-08-02 NOTE — Progress Notes (Signed)
   Subjective:    Patient ID: Patty Bullock, female    DOB: 07-14-1960, 62 y.o.   MRN: [id number]  Knee Pain  The incident occurred 3 to 5 days ago. The incident occurred at home. The injury mechanism was a twisting injury. The pain is present in the right knee. The quality of the pain is described as aching. The pain is at a severity of 2/10 (ws 8/10 to start with). The pain is mild. The pain has been Intermittent since onset. Associated symptoms include an inability to bear weight (but is better). Pertinent negatives include no loss of motion, loss of sensation or muscle weakness. She reports no foreign bodies present. The symptoms are aggravated by movement and weight bearing. Treatments tried: freeze and absorbine junior. The treatment provided mild relief.      Review of Systems  Constitutional:  Negative for diaphoresis.  Eyes:  Negative for pain.  Respiratory:  Negative for shortness of breath.   Cardiovascular:  Negative for chest pain, palpitations and leg swelling.  Gastrointestinal:  Negative for abdominal pain.  Endocrine: Negative for polydipsia.  Skin:  Negative for rash.  Neurological:  Negative for dizziness, weakness and headaches.  Hematological:  Does not bruise/bleed easily.  All other systems reviewed and are negative.      Objective:   Physical Exam Vitals reviewed.  Constitutional:      Appearance: Normal appearance.  Cardiovascular:     Rate and Rhythm: Normal rate and regular rhythm.     Heart sounds: Normal heart sounds.  Pulmonary:     Effort: Pulmonary effort is normal.     Breath sounds: Normal breath sounds.  Musculoskeletal:     Comments: FROM of right knee without pain of crepitus No right knee effusion No patella tenderness All ligaments intact  Skin:    General: Skin is warm.  Neurological:     General: No focal deficit present.     Mental Status: She is alert and oriented to person, place, and time.  Psychiatric:        Mood and  Affect: Mood normal.        Behavior: Behavior normal.     BP (!) 153/92   Pulse 68   Temp 97.8 F (36.6 C) (Temporal)   Resp 20   Ht 5\' 4"  (1.626 m)   Wt 203 lb (92.1 kg)   SpO2 99%   BMI 34.84 kg/m        Assessment & Plan:   Patty Bullock in today with chief complaint of Knee Pain (Right knee/No injury)   1. Acute pain of right knee Ice Elevate when sitting RTO prn - celecoxib (CELEBREX) 200 MG capsule; Take 1 capsule (200 mg total) by mouth 2 (two) times daily.  Dispense: 40 capsule; Refill: 0    The above assessment and management plan was discussed with the patient. The patient verbalized understanding of and has agreed to the management plan. Patient is aware to call the clinic if symptoms persist or worsen. Patient is aware when to return to the clinic for a follow-up visit. Patient educated on when it is appropriate to go to the emergency department.   Mary-Margaret Daphine Deutscher, FNP

## 2022-08-02 NOTE — Patient Instructions (Signed)
Acute Knee Pain, Adult Many things can cause knee pain. Sometimes, knee pain is sudden (acute) and may be caused by damage, swelling, or irritation of the muscles and tissues that support your knee. The pain often goes away on its own with time and rest. If the pain does not go away, tests may be done to find out what is causing the pain. Follow these instructions at home: If you have a knee sleeve or brace:  Wear the knee sleeve or brace as told by your doctor. Take it off only as told by your doctor. Loosen it if your toes: Tingle. Become numb. Turn cold and blue. Keep it clean. If the knee sleeve or brace is not waterproof: Do not let it get wet. Cover it with a watertight covering when you take a bath or shower. Activity Rest your knee. Do not do things that cause pain or make pain worse. Avoid activities where both feet leave the ground at the same time (high-impact activities). Examples are running, jumping rope, and doing jumping jacks. Work with a physical therapist to make a safe exercise program, as told by your doctor. Managing pain, stiffness, and swelling  If told, put ice on the knee. To do this: If you have a removable knee sleeve or brace, take it off as told by your doctor. Put ice in a plastic bag. Place a towel between your skin and the bag. Leave the ice on for 20 minutes, 2-3 times a day. Take off the ice if your skin turns bright red. This is very important. If you cannot feel pain, heat, or cold, you have a greater risk of damage to the area. If told, use an elastic bandage to put pressure (compression) on your injured knee. Raise your knee above the level of your heart while you are sitting or lying down. Sleep with a pillow under your knee. General instructions Take over-the-counter and prescription medicines only as told by your doctor. Do not smoke or use any products that contain nicotine or tobacco. If you need help quitting, ask your doctor. If you are  overweight, work with your doctor and a food expert (dietitian) to set goals to lose weight. Being overweight can make your knee hurt more. Watch for any changes in your symptoms. Keep all follow-up visits. Contact a doctor if: The knee pain does not stop. The knee pain changes or gets worse. You have a fever along with knee pain. Your knee is red or feels warm when you touch it. Your knee gives out or locks up. Get help right away if: Your knee swells, and the swelling gets worse. You cannot move your knee. You have very bad knee pain that does not get better with pain medicine. Summary Many things can cause knee pain. The pain often goes away on its own with time and rest. Your doctor may do tests to find out the cause of the pain. Watch for any changes in your symptoms. Relieve your pain with rest, medicines, light activity, and use of ice. Get help right away if you cannot move your knee or your knee pain is very bad. This information is not intended to replace advice given to you by your health care provider. Make sure you discuss any questions you have with your health care provider. Document Revised: 07/16/2019 Document Reviewed: 07/17/2019 Elsevier Patient Education  2024 [employer]  

## 2022-09-26 ENCOUNTER — Other Ambulatory Visit: Payer: Self-pay

## 2022-09-26 DIAGNOSIS — Z1231 Encounter for screening mammogram for malignant neoplasm of breast: Secondary | ICD-10-CM

## 2022-11-25 ENCOUNTER — Encounter: Payer: Self-pay | Admitting: Nurse Practitioner

## 2024-02-20 ENCOUNTER — Ambulatory Visit: Payer: Self-pay

## 2024-02-20 NOTE — Telephone Encounter (Signed)
 FYI Only or Action Required?: Action required by provider: decline dispostion requested appointment with PCP booked 02/22/24 at 12 PM .  Patient was last seen in primary care on 08/02/2022 by Gladis Mustard, FNP.  Called Nurse Triage reporting Back Pain.  Symptoms began several weeks ago.  Interventions attempted: Other: tiger balm .  Symptoms are: gradually worsening.  Triage Disposition: Discuss With PCP and Callback by Nurse Today (overriding See HCP Within 4 Hours (Or PCP Triage))  Patient/caregiver understands and will follow disposition?: Yes        Reason for Disposition  [1] SEVERE back pain (e.g., excruciating, unable to do any normal activities) AND [2] not improved 2 hours after pain medicine  Answer Assessment - Initial Assessment Questions Patient reports years of lower back was burnign when first started , then dull pain could live with it got extrtemly bad years back with traveling went to doctor in [place]   got sent to chriocprator, helped but hasn't been back, now pain is worse over the past few weeks. Lower back mostly right side going into left side. Pain is constant. Pain level 10/10 all the time even at rest or activity.  Has been using biofreeze didn't help tried tiger balm sort of helped, today didn't touch it. Hasn't taken any kind of pills. Hard to sleep can't get comfortable. No injury.  Patient requesting appointment at office  booked soonest appointment  1/8 at 12 noon with PCP per patient request . Patient agreeable with ER if worsening symptoms in meantime.    1. ONSET: When did the pain begin? (e.g., minutes, hours, days)     Chronic back pain years that has gotten worse over the years , most worst in past 3 weeks or so  2. LOCATION: Where does it hurt? (upper, mid or lower back)     Lower back mostly right side but going into left side now  3. SEVERITY: How bad is the pain?  (e.g., Scale 1-10; mild, moderate, or severe)     10/10 all the  time doesn't matter sitting standing lying down 4. PATTERN: Is the pain constant? (e.g., yes, no; constant, intermittent)      Constant  5. RADIATION: Does the pain shoot into your legs or somewhere else?     Localized to back  6. CAUSE:  What do you think is causing the back pain?      Unknown mentions chronic back pain   8. MEDICINES: What have you taken so far for the pain? (e.g., nothing, acetaminophen, NSAIDS)     Doesn't take pain medication pills but does use topical balm which was helping and now isnt  9. NEUROLOGIC SYMPTOMS: Do you have any weakness, numbness, or problems with bowel/bladder control?     Patient denies the following any weakness, numbness, or problems with bowel/bladder control ,  chest pain, difficulty breathing, vomiting, fever abdominal pain , dysuria, blood in urine  10. OTHER SYMPTOMS: Do you have any other symptoms? (e.g., fever, abdomen pain, burning with urination, blood in urine)       Reporting the lower back pain is concern.  Protocols used: Back Pain-A-AH  Copied from CRM [id number]. Topic: Clinical - Red Word Triage >> Feb 20, 2024  4:17 PM Alfonso ORN wrote: Red Word that prompted transfer to Nurse Triage: patient stated she been dealing with back issue for a while gotten worsen lower back hard time walking rate pain 10   ----------------------------------------------------------------------- From previous Reason for Contact - Scheduling: Patient/patient representative  is calling to schedule an appointment. Refer to attachments for appointment information.

## 2024-02-21 NOTE — Telephone Encounter (Signed)
"  Appt made   "

## 2024-02-22 ENCOUNTER — Encounter: Payer: Self-pay | Admitting: Nurse Practitioner

## 2024-02-22 ENCOUNTER — Ambulatory Visit: Admitting: Nurse Practitioner

## 2024-02-22 VITALS — BP 146/89 | HR 68 | Temp 97.2°F | Ht 64.0 in | Wt 221.0 lb

## 2024-02-22 DIAGNOSIS — G8929 Other chronic pain: Secondary | ICD-10-CM

## 2024-02-22 DIAGNOSIS — M545 Low back pain, unspecified: Secondary | ICD-10-CM | POA: Diagnosis not present

## 2024-02-22 MED ORDER — METHYLPREDNISOLONE ACETATE 80 MG/ML IJ SUSP
80.0000 mg | Freq: Once | INTRAMUSCULAR | Status: AC
  Administered 2024-02-22: 80 mg via INTRAMUSCULAR

## 2024-02-22 MED ORDER — KETOROLAC TROMETHAMINE 60 MG/2ML IM SOLN
60.0000 mg | Freq: Once | INTRAMUSCULAR | Status: AC
  Administered 2024-02-22: 60 mg via INTRAMUSCULAR

## 2024-02-22 NOTE — Patient Instructions (Signed)
 Back Exercises These exercises help to make your trunk and back strong. They also help to keep the lower back flexible. Doing these exercises can help to prevent or lessen pain in your lower back. If you have back pain, try to do these exercises 2-3 times each day or as told by your doctor. As you get better, do the exercises once each day. Repeat the exercises more often as told by your doctor. To stop back pain from coming back, do the exercises once each day, or as told by your doctor. Do exercises exactly as told by your doctor. Stop right away if you feel sudden pain or your pain gets worse. Exercises Single knee to chest Do these steps 3-5 times in a row for each leg: Lie on your back on a firm bed or the floor with your legs stretched out. Bring one knee to your chest. Grab your knee or thigh with both hands and hold it in place. Pull on your knee until you feel a gentle stretch in your lower back or butt. Keep doing the stretch for 10-30 seconds. Slowly let go of your leg and straighten it. Pelvic tilt Do these steps 5-10 times in a row: Lie on your back on a firm bed or the floor with your legs stretched out. Bend your knees so they point up to the ceiling. Your feet should be flat on the floor. Tighten your lower belly (abdomen) muscles to press your lower back against the floor. This will make your tailbone point up to the ceiling instead of pointing down to your feet or the floor. Stay in this position for 5-10 seconds while you gently tighten your muscles and breathe evenly. Cat-cow Do these steps until your lower back bends more easily: Get on your hands and knees on a firm bed or the floor. Keep your hands under your shoulders, and keep your knees under your hips. You may put padding under your knees. Let your head hang down toward your chest. Tighten (contract) the muscles in your belly. Point your tailbone toward the floor so your lower back becomes rounded like the back of a  cat. Stay in this position for 5 seconds. Slowly lift your head. Let the muscles of your belly relax. Point your tailbone up toward the ceiling so your back forms a sagging arch like the back of a cow. Stay in this position for 5 seconds.  Press-ups Do these steps 5-10 times in a row: Lie on your belly (face-down) on a firm bed or the floor. Place your hands near your head, about shoulder-width apart. While you keep your back relaxed and keep your hips on the floor, slowly straighten your arms to raise the top half of your body and lift your shoulders. Do not use your back muscles. You may change where you place your hands to make yourself more comfortable. Stay in this position for 5 seconds. Keep your back relaxed. Slowly return to lying flat on the floor.  Bridges Do these steps 10 times in a row: Lie on your back on a firm bed or the floor. Bend your knees so they point up to the ceiling. Your feet should be flat on the floor. Your arms should be flat at your sides, next to your body. Tighten your butt muscles and lift your butt off the floor until your waist is almost as high as your knees. If you do not feel the muscles working in your butt and the back of  your thighs, slide your feet 1-2 inches (2.5-5 cm) farther away from your butt. Stay in this position for 3-5 seconds. Slowly lower your butt to the floor, and let your butt muscles relax. If this exercise is too easy, try doing it with your arms crossed over your chest. Belly crunches Do these steps 5-10 times in a row: Lie on your back on a firm bed or the floor with your legs stretched out. Bend your knees so they point up to the ceiling. Your feet should be flat on the floor. Cross your arms over your chest. Tip your chin a little bit toward your chest, but do not bend your neck. Tighten your belly muscles and slowly raise your chest just enough to lift your shoulder blades a tiny bit off the floor. Avoid raising your body  higher than that because it can put too much stress on your lower back. Slowly lower your chest and your head to the floor. Back lifts Do these steps 5-10 times in a row: Lie on your belly (face-down) with your arms at your sides, and rest your forehead on the floor. Tighten the muscles in your legs and your butt. Slowly lift your chest off the floor while you keep your hips on the floor. Keep the back of your head in line with the curve in your back. Look at the floor while you do this. Stay in this position for 3-5 seconds. Slowly lower your chest and your face to the floor. Contact a doctor if: Your back pain gets a lot worse when you do an exercise. Your back pain does not get better within 2 hours after you exercise. If you have any of these problems, stop doing the exercises. Do not do them again unless your doctor says it is okay. Get help right away if: You have sudden, very bad back pain. If this happens, stop doing the exercises. Do not do them again unless your doctor says it is okay. This information is not intended to replace advice given to you by your health care provider. Make sure you discuss any questions you have with your health care provider. Document Revised: 04/15/2020 Document Reviewed: 04/15/2020 Elsevier Patient Education  2024 [employer].

## 2024-02-22 NOTE — Progress Notes (Signed)
" ° °  Subjective:    Patient ID: Patty Bullock, female    DOB: February 03, 1961, 65 y.o.   MRN: [id number]   Chief Complaint: Back Pain   Back Pain This is a chronic problem. The current episode started more than 1 year ago. The problem occurs intermittently. The problem has been waxing and waning since onset. The pain is present in the lumbar spine. The quality of the pain is described as aching. The pain does not radiate. The pain is at a severity of 9/10. The pain is moderate. The pain is The same all the time. The symptoms are aggravated by sitting, standing and lying down. Pertinent negatives include no pelvic pain, perianal numbness, tingling or weakness. She has tried analgesics (biofreeze) for the symptoms. The treatment provided no relief.    Patient Active Problem List   Diagnosis Date Noted   Hypertension 12/13/2019   Hypothyroidism 12/13/2019  '    Review of Systems  Genitourinary:  Negative for pelvic pain.  Musculoskeletal:  Positive for back pain.  Neurological:  Negative for tingling and weakness.       Objective:   Physical Exam Constitutional:      Appearance: Normal appearance. She is obese.  Cardiovascular:     Rate and Rhythm: Normal rate and regular rhythm.     Heart sounds: Normal heart sounds.  Pulmonary:     Breath sounds: Normal breath sounds.  Musculoskeletal:     Comments: Rises slowly from sitting to standing Decrease ROM of lumbar spine with pain on flexion and extension (-) SLR bil Motor strength and sensation distally intact  Skin:    General: Skin is warm.  Neurological:     General: No focal deficit present.     Mental Status: She is alert and oriented to person, place, and time.  Psychiatric:        Mood and Affect: Mood normal.        Behavior: Behavior normal.    BP (!) 146/89   Pulse 68   Temp (!) 97.2 F (36.2 C) (Temporal)   Ht 5' 4 (1.626 m)   Wt 221 lb (100.2 kg)   SpO2 97%   BMI 37.93 kg/m         Assessment & Plan:    JENIPHER HAVEL in today with chief complaint of Back Pain   1. Chronic midline low back pain without sciatica (Primary) Moist heat Heat patches when working Daily back stretches RTO prn - methylPREDNISolone  acetate (DEPO-MEDROL ) injection 80 mg - ketorolac  (TORADOL ) injection 60 mg    The above assessment and management plan was discussed with the patient. The patient verbalized understanding of and has agreed to the management plan. Patient is aware to call the clinic if symptoms persist or worsen. Patient is aware when to return to the clinic for a follow-up visit. Patient educated on when it is appropriate to go to the emergency department.   Mary-Margaret Gladis, FNP   "

## 2024-02-28 ENCOUNTER — Encounter: Payer: Self-pay | Admitting: Nurse Practitioner

## 2024-03-07 ENCOUNTER — Other Ambulatory Visit (HOSPITAL_COMMUNITY)
Admission: RE | Admit: 2024-03-07 | Discharge: 2024-03-07 | Disposition: A | Source: Ambulatory Visit | Attending: Nurse Practitioner | Admitting: Nurse Practitioner

## 2024-03-07 ENCOUNTER — Ambulatory Visit (INDEPENDENT_AMBULATORY_CARE_PROVIDER_SITE_OTHER): Admitting: Nurse Practitioner

## 2024-03-07 ENCOUNTER — Encounter: Payer: Self-pay | Admitting: Nurse Practitioner

## 2024-03-07 VITALS — BP 136/82 | HR 68 | Temp 97.4°F | Ht 64.0 in | Wt 223.0 lb

## 2024-03-07 DIAGNOSIS — I10 Essential (primary) hypertension: Secondary | ICD-10-CM

## 2024-03-07 DIAGNOSIS — E039 Hypothyroidism, unspecified: Secondary | ICD-10-CM | POA: Diagnosis not present

## 2024-03-07 DIAGNOSIS — Z0000 Encounter for general adult medical examination without abnormal findings: Secondary | ICD-10-CM | POA: Diagnosis not present

## 2024-03-07 DIAGNOSIS — F17200 Nicotine dependence, unspecified, uncomplicated: Secondary | ICD-10-CM

## 2024-03-07 DIAGNOSIS — E782 Mixed hyperlipidemia: Secondary | ICD-10-CM

## 2024-03-07 MED ORDER — LISINOPRIL 20 MG PO TABS: 20.0000 mg | ORAL_TABLET | Freq: Every day | ORAL | 1 refills | Status: AC

## 2024-03-07 MED ORDER — LEVOTHYROXINE SODIUM 50 MCG PO TABS: 50.0000 ug | ORAL_TABLET | Freq: Every day | ORAL | 1 refills | Status: AC

## 2024-03-07 MED ORDER — BETAMETHASONE VALERATE 0.12 % EX FOAM: 1.0000 | Freq: Two times a day (BID) | CUTANEOUS | 3 refills | Status: AC

## 2024-03-07 MED ORDER — ATORVASTATIN CALCIUM 40 MG PO TABS: 40.0000 mg | ORAL_TABLET | Freq: Every day | ORAL | 1 refills | Status: AC

## 2024-03-07 NOTE — Patient Instructions (Signed)

## 2024-03-07 NOTE — Progress Notes (Signed)
 "  Subjective:    Patient ID: Patty Bullock, female    DOB: December 01, 1960, 64 y.o.   MRN: [id number]   Chief Complaint: annual physical  Patient comes in today for annual physical exam and PAP. Patient has no current medical problems and iis  On no medications. She has no complaints today.  Hypertension: - Does not check blood pressure at  home - Denies chest pain, sob or headaches. - Is currently on lisinopril  20mg  daily BP Readings from Last 3 Encounters:  02/22/24 (!) 146/89  08/02/22 (!) 153/92  01/13/20 121/79   Hyperlipidemia: - does not watch diet closely and does no dedicated exercise - Is on lipitor daily Lab Results  Component Value Date   CHOL 222 (H) 01/13/2020   HDL 49 01/13/2020   LDLCALC 154 (H) 01/13/2020   TRIG 104 01/13/2020   CHOLHDL 4.5 (H) 01/13/2020   Hypothyroidism: - No issues that she is aware of - is on synthroid  50mcg daily Lab Results  Component Value Date   TSH 6.980 (H) 12/12/2019    smoker - Less then 1 pack a day - has never had low dose CT scan - Denies cough     Review of Systems  Constitutional:  Negative for diaphoresis.  Eyes:  Negative for pain.  Respiratory:  Negative for shortness of breath.   Cardiovascular:  Negative for chest pain, palpitations and leg swelling.  Gastrointestinal:  Negative for abdominal pain.  Endocrine: Negative for polydipsia.  Skin:  Negative for rash.  Neurological:  Negative for dizziness, weakness and headaches.  Hematological:  Does not bruise/bleed easily.  All other systems reviewed and are negative.      Objective:   Physical Exam Vitals and nursing note reviewed.  Constitutional:      General: She is not in acute distress.    Appearance: Normal appearance. She is well-developed.  HENT:     Head: Normocephalic.     Nose: Nose normal.  Eyes:     Pupils: Pupils are equal, round, and reactive to light.  Neck:     Vascular: No carotid bruit or JVD.  Cardiovascular:     Rate and  Rhythm: Normal rate and regular rhythm.     Heart sounds: Normal heart sounds.  Pulmonary:     Effort: Pulmonary effort is normal. No respiratory distress.     Breath sounds: Normal breath sounds. No wheezing or rales.  Chest:     Chest wall: No tenderness.  Abdominal:     General: Bowel sounds are normal. There is no distension or abdominal bruit.     Palpations: Abdomen is soft. There is no hepatomegaly, splenomegaly, mass or pulsatile mass.     Tenderness: There is no abdominal tenderness.  Genitourinary:    General: Normal vulva.     Vagina: No vaginal discharge.     Rectum: Normal.     Comments: Cervix parous and pink No adnexal masses or tenderness Musculoskeletal:        General: Normal range of motion.     Cervical back: Normal range of motion and neck supple.  Lymphadenopathy:     Cervical: No cervical adenopathy.  Skin:    General: Skin is warm and dry.  Neurological:     Mental Status: She is alert and oriented to person, place, and time.     Deep Tendon Reflexes: Reflexes are normal and symmetric.  Psychiatric:        Behavior: Behavior normal.  Thought Content: Thought content normal.        Judgment: Judgment normal.      BP 136/82   Pulse 68   Temp (!) 97.4 F (36.3 C) (Temporal)   Ht 5' 4 (1.626 m)   Wt 223 lb (101.2 kg)   SpO2 98%   BMI 38.28 kg/m      Assessment & Plan:  Patty Bullock comes in today with chief complaint of Annual Exam   Diagnosis and orders addressed:  1. Annual physical exam (Primary) - CBC with Differential/Platelet - VITAMIN D  25 Hydroxy (Vit-D Deficiency, Fractures) - Cytology - PAP  2. Primary hypertension Dash diet - CMP14+EGFR - lisinopril  (ZESTRIL ) 20 MG tablet; Take 1 tablet (20 mg total) by mouth daily.  Dispense: 90 tablet; Refill: 1  3. Mixed hyperlipidemia Low fat diet - Lipid panel  4. Acquired hypothyroidism Labs pending - Thyroid  Panel With TSH - levothyroxine  (SYNTHROID ) 50 MCG tablet;  Take 1 tablet (50 mcg total) by mouth daily. (NEEDS TO BE SEEN BEFORE NEXT REFILL)  Dispense: 90 tablet; Refill: 1  5. Smoker Smoking cessation encouraged - Ambulatory Referral Lung Cancer Screening [facility]   Labs pending Health Maintenance reviewed Diet and exercise encouraged  Follow up plan: 6 months   Mary-Margaret Gladis, FNP  "

## 2024-03-08 ENCOUNTER — Ambulatory Visit: Payer: Self-pay | Admitting: Nurse Practitioner

## 2024-03-08 DIAGNOSIS — E559 Vitamin D deficiency, unspecified: Secondary | ICD-10-CM

## 2024-03-08 LAB — CBC WITH DIFFERENTIAL/PLATELET
Basophils Absolute: 0.1 x10E3/uL (ref 0.0–0.2)
Basos: 1 %
EOS (ABSOLUTE): 0 x10E3/uL (ref 0.0–0.4)
Eos: 0 %
Hematocrit: 45.5 % (ref 34.0–46.6)
Hemoglobin: 14.7 g/dL (ref 11.1–15.9)
Immature Grans (Abs): 0 x10E3/uL (ref 0.0–0.1)
Immature Granulocytes: 0 %
Lymphocytes Absolute: 2.9 x10E3/uL (ref 0.7–3.1)
Lymphs: 33 %
MCH: 30.8 pg (ref 26.6–33.0)
MCHC: 32.3 g/dL (ref 31.5–35.7)
MCV: 95 fL (ref 79–97)
Monocytes Absolute: 0.8 x10E3/uL (ref 0.1–0.9)
Monocytes: 9 %
Neutrophils Absolute: 5.1 x10E3/uL (ref 1.4–7.0)
Neutrophils: 57 %
Platelets: 307 x10E3/uL (ref 150–450)
RBC: 4.77 x10E6/uL (ref 3.77–5.28)
RDW: 12.6 % (ref 11.7–15.4)
WBC: 8.8 x10E3/uL (ref 3.4–10.8)

## 2024-03-08 LAB — LIPID PANEL
Chol/HDL Ratio: 3.4 ratio (ref 0.0–4.4)
Cholesterol, Total: 236 mg/dL — ABNORMAL HIGH (ref 100–199)
HDL: 70 mg/dL
LDL Chol Calc (NIH): 154 mg/dL — ABNORMAL HIGH (ref 0–99)
Triglycerides: 70 mg/dL (ref 0–149)
VLDL Cholesterol Cal: 12 mg/dL (ref 5–40)

## 2024-03-08 LAB — CYTOLOGY - PAP
Adequacy: ABSENT
Diagnosis: NEGATIVE

## 2024-03-08 LAB — CMP14+EGFR
ALT: 16 IU/L (ref 0–32)
AST: 19 IU/L (ref 0–40)
Albumin: 4.2 g/dL (ref 3.9–4.9)
Alkaline Phosphatase: 99 IU/L (ref 49–135)
BUN/Creatinine Ratio: 22 (ref 12–28)
BUN: 20 mg/dL (ref 8–27)
Bilirubin Total: 0.3 mg/dL (ref 0.0–1.2)
CO2: 25 mmol/L (ref 20–29)
Calcium: 9.2 mg/dL (ref 8.7–10.3)
Chloride: 102 mmol/L (ref 96–106)
Creatinine, Ser: 0.9 mg/dL (ref 0.57–1.00)
Globulin, Total: 2.5 g/dL (ref 1.5–4.5)
Glucose: 84 mg/dL (ref 70–99)
Potassium: 4.9 mmol/L (ref 3.5–5.2)
Sodium: 141 mmol/L (ref 134–144)
Total Protein: 6.7 g/dL (ref 6.0–8.5)
eGFR: 71 mL/min/1.73

## 2024-03-08 LAB — VITAMIN D 25 HYDROXY (VIT D DEFICIENCY, FRACTURES): Vit D, 25-Hydroxy: 10.9 ng/mL — AB (ref 30.0–100.0)

## 2024-03-08 LAB — THYROID PANEL WITH TSH
Free Thyroxine Index: 1.7 (ref 1.2–4.9)
T3 Uptake Ratio: 25 % (ref 24–39)
T4, Total: 6.8 ug/dL (ref 4.5–12.0)
TSH: 3.03 u[IU]/mL (ref 0.450–4.500)

## 2024-03-21 ENCOUNTER — Encounter: Payer: Self-pay | Admitting: Emergency Medicine

## 2024-03-22 ENCOUNTER — Other Ambulatory Visit: Payer: Self-pay | Admitting: Nurse Practitioner

## 2024-03-22 DIAGNOSIS — Z1231 Encounter for screening mammogram for malignant neoplasm of breast: Secondary | ICD-10-CM

## 2024-03-25 ENCOUNTER — Inpatient Hospital Stay: Admission: RE | Admit: 2024-03-25 | Source: Ambulatory Visit

## 2024-08-30 ENCOUNTER — Ambulatory Visit: Admitting: Nurse Practitioner
